# Patient Record
Sex: Female | Born: 1980 | Race: Black or African American | Hispanic: No | Marital: Single | Smoking: Never smoker
Health system: Southern US, Community
[De-identification: ages and names within clinical notes are randomized; demographics above are authoritative.]

## PROBLEM LIST (undated history)

## (undated) DIAGNOSIS — T884XXA Failed or difficult intubation, initial encounter: Secondary | ICD-10-CM

## (undated) DIAGNOSIS — Z789 Other specified health status: Secondary | ICD-10-CM

## (undated) HISTORY — PX: BARIATRIC SURGERY: SHX1103

---

## 2016-06-26 ENCOUNTER — Emergency Department (HOSPITAL_BASED_OUTPATIENT_CLINIC_OR_DEPARTMENT_OTHER)
Admission: EM | Admit: 2016-06-26 | Discharge: 2016-06-26 | Disposition: A | Discharge status: Home / Self Care | Payer: Medicaid Other | Attending: Emergency Medicine | Admitting: Emergency Medicine

## 2016-06-26 ENCOUNTER — Encounter (HOSPITAL_BASED_OUTPATIENT_CLINIC_OR_DEPARTMENT_OTHER): Payer: Self-pay | Admitting: Emergency Medicine

## 2016-06-26 ENCOUNTER — Emergency Department (HOSPITAL_BASED_OUTPATIENT_CLINIC_OR_DEPARTMENT_OTHER): Payer: Medicaid Other

## 2016-06-26 DIAGNOSIS — S6992XA Unspecified injury of left wrist, hand and finger(s), initial encounter: Secondary | ICD-10-CM | POA: Diagnosis present

## 2016-06-26 DIAGNOSIS — S6392XA Sprain of unspecified part of left wrist and hand, initial encounter: Secondary | ICD-10-CM | POA: Insufficient documentation

## 2016-06-26 DIAGNOSIS — S0990XA Unspecified injury of head, initial encounter: Secondary | ICD-10-CM | POA: Insufficient documentation

## 2016-06-26 DIAGNOSIS — Y999 Unspecified external cause status: Secondary | ICD-10-CM | POA: Insufficient documentation

## 2016-06-26 DIAGNOSIS — S63502A Unspecified sprain of left wrist, initial encounter: Secondary | ICD-10-CM

## 2016-06-26 DIAGNOSIS — Y939 Activity, unspecified: Secondary | ICD-10-CM | POA: Insufficient documentation

## 2016-06-26 DIAGNOSIS — Y929 Unspecified place or not applicable: Secondary | ICD-10-CM | POA: Diagnosis not present
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
## 2016-06-26 NOTE — Discharge Instructions (Signed)
Ibuprofen 600 mg every 6 hours as needed for pain.  Rest.  Return to the emergency department if your symptoms significantly worsen or change.

## 2019-05-23 ENCOUNTER — Emergency Department (HOSPITAL_BASED_OUTPATIENT_CLINIC_OR_DEPARTMENT_OTHER)
Admission: EM | Admit: 2019-05-23 | Discharge: 2019-05-23 | Disposition: A | Discharge status: Home / Self Care | Payer: Medicaid Other | Attending: Emergency Medicine | Admitting: Emergency Medicine

## 2019-05-23 ENCOUNTER — Encounter (HOSPITAL_BASED_OUTPATIENT_CLINIC_OR_DEPARTMENT_OTHER): Payer: Self-pay | Admitting: *Deleted

## 2019-05-23 ENCOUNTER — Other Ambulatory Visit: Payer: Self-pay

## 2019-05-23 DIAGNOSIS — Z5321 Procedure and treatment not carried out due to patient leaving prior to being seen by health care provider: Secondary | ICD-10-CM | POA: Diagnosis not present

## 2019-05-23 DIAGNOSIS — R1110 Vomiting, unspecified: Secondary | ICD-10-CM | POA: Insufficient documentation

## 2019-05-23 DIAGNOSIS — R109 Unspecified abdominal pain: Secondary | ICD-10-CM | POA: Insufficient documentation
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
## 2019-05-24 ENCOUNTER — Encounter (HOSPITAL_COMMUNITY)
Admission: EM | Disposition: A | Discharge status: Home / Self Care | Payer: Self-pay | Source: Home / Self Care | Attending: Emergency Medicine

## 2019-05-24 ENCOUNTER — Encounter (HOSPITAL_BASED_OUTPATIENT_CLINIC_OR_DEPARTMENT_OTHER): Payer: Self-pay | Admitting: Emergency Medicine

## 2019-05-24 ENCOUNTER — Ambulatory Visit (HOSPITAL_BASED_OUTPATIENT_CLINIC_OR_DEPARTMENT_OTHER)
Admission: EM | Admit: 2019-05-24 | Discharge: 2019-05-25 | Disposition: A | Discharge status: Home / Self Care | Payer: Medicaid Other | Attending: Family Medicine | Admitting: Family Medicine

## 2019-05-24 ENCOUNTER — Inpatient Hospital Stay (HOSPITAL_COMMUNITY): Payer: Medicaid Other | Admitting: Anesthesiology

## 2019-05-24 ENCOUNTER — Emergency Department (HOSPITAL_BASED_OUTPATIENT_CLINIC_OR_DEPARTMENT_OTHER): Payer: Medicaid Other

## 2019-05-24 DIAGNOSIS — K661 Hemoperitoneum: Secondary | ICD-10-CM | POA: Diagnosis not present

## 2019-05-24 DIAGNOSIS — O00101 Right tubal pregnancy without intrauterine pregnancy: Secondary | ICD-10-CM

## 2019-05-24 DIAGNOSIS — X58XXXA Exposure to other specified factors, initial encounter: Secondary | ICD-10-CM | POA: Insufficient documentation

## 2019-05-24 DIAGNOSIS — N8311 Corpus luteum cyst of right ovary: Secondary | ICD-10-CM | POA: Insufficient documentation

## 2019-05-24 DIAGNOSIS — Z20828 Contact with and (suspected) exposure to other viral communicable diseases: Secondary | ICD-10-CM | POA: Insufficient documentation

## 2019-05-24 DIAGNOSIS — Z30432 Encounter for removal of intrauterine contraceptive device: Secondary | ICD-10-CM | POA: Diagnosis not present

## 2019-05-24 DIAGNOSIS — T85628A Displacement of other specified internal prosthetic devices, implants and grafts, initial encounter: Secondary | ICD-10-CM | POA: Insufficient documentation

## 2019-05-24 DIAGNOSIS — O009 Unspecified ectopic pregnancy without intrauterine pregnancy: Secondary | ICD-10-CM

## 2019-05-24 DIAGNOSIS — Z3A00 Weeks of gestation of pregnancy not specified: Secondary | ICD-10-CM | POA: Diagnosis not present

## 2019-05-24 DIAGNOSIS — Z9884 Bariatric surgery status: Secondary | ICD-10-CM | POA: Insufficient documentation

## 2019-05-24 DIAGNOSIS — R1031 Right lower quadrant pain: Secondary | ICD-10-CM | POA: Diagnosis present

## 2019-05-24 HISTORY — DX: Other specified health status: Z78.9

## 2019-05-24 HISTORY — PX: LAPAROSCOPIC SALPINGO OOPHERECTOMY: SHX5927

## 2019-05-24 HISTORY — DX: Failed or difficult intubation, initial encounter: T88.4XXA

## 2019-05-24 HISTORY — PX: IUD REMOVAL: SHX5392

## 2019-05-24 LAB — TYPE AND SCREEN
ABO/RH(D): O POS
Antibody Screen: NEGATIVE

## 2019-05-24 LAB — CBC
HCT: 36.4 % (ref 36.0–46.0)
Hemoglobin: 11.6 g/dL — ABNORMAL LOW (ref 12.0–15.0)
MCH: 26.4 pg (ref 26.0–34.0)
MCHC: 31.9 g/dL (ref 30.0–36.0)
MCV: 82.7 fL (ref 80.0–100.0)
Platelets: 235 10*3/uL (ref 150–400)
RBC: 4.4 MIL/uL (ref 3.87–5.11)
RDW: 13.9 % (ref 11.5–15.5)
WBC: 5.4 10*3/uL (ref 4.0–10.5)
nRBC: 0 % (ref 0.0–0.2)

## 2019-05-24 LAB — COMPREHENSIVE METABOLIC PANEL
ALT: 12 U/L (ref 0–44)
AST: 15 U/L (ref 15–41)
Albumin: 3.8 g/dL (ref 3.5–5.0)
Alkaline Phosphatase: 55 U/L (ref 38–126)
Anion gap: 7 (ref 5–15)
BUN: 14 mg/dL (ref 6–20)
CO2: 25 mmol/L (ref 22–32)
Calcium: 9.1 mg/dL (ref 8.9–10.3)
Chloride: 102 mmol/L (ref 98–111)
Creatinine, Ser: 0.74 mg/dL (ref 0.44–1.00)
GFR calc Af Amer: >60 mL/min (ref >60)
GFR calc non Af Amer: >60 mL/min (ref >60)
Glucose, Bld: 81 mg/dL (ref 70–99)
Potassium: 3.9 mmol/L (ref 3.5–5.1)
Sodium: 134 mmol/L — ABNORMAL LOW (ref 135–145)
Total Bilirubin: 0.6 mg/dL (ref 0.3–1.2)
Total Protein: 7.1 g/dL (ref 6.5–8.1)

## 2019-05-24 LAB — URINALYSIS, ROUTINE W REFLEX MICROSCOPIC
Bilirubin Urine: NEGATIVE
Glucose, UA: NEGATIVE mg/dL
Ketones, ur: NEGATIVE mg/dL
Leukocytes,Ua: NEGATIVE
Nitrite: NEGATIVE
Protein, ur: NEGATIVE mg/dL
Specific Gravity, Urine: 1.02 (ref 1.005–1.030)
pH: 7 (ref 5.0–8.0)

## 2019-05-24 LAB — PREGNANCY, URINE: Preg Test, Ur: POSITIVE — AB

## 2019-05-24 LAB — URINALYSIS, MICROSCOPIC (REFLEX)

## 2019-05-24 LAB — ABO/RH: ABO/RH(D): O POS

## 2019-05-24 LAB — LIPASE, BLOOD: Lipase: 28 U/L (ref 11–51)

## 2019-05-24 LAB — HCG, QUANTITATIVE, PREGNANCY: hCG, Beta Chain, Quant, S: 3039 m[IU]/mL — ABNORMAL HIGH (ref <5)

## 2019-05-24 LAB — SARS CORONAVIRUS 2 AG (30 MIN TAT): SARS Coronavirus 2 Ag: NEGATIVE

## 2019-05-24 SURGERY — SALPINGO-OOPHORECTOMY, LAPAROSCOPIC
Anesthesia: General

## 2019-05-24 MED ORDER — ROCURONIUM BROMIDE 10 MG/ML (PF) SYRINGE
PREFILLED_SYRINGE | INTRAVENOUS | Status: AC
  Filled 2019-05-24: qty 10

## 2019-05-24 MED ORDER — SODIUM CHLORIDE 0.9 % IR SOLN
Status: DC | PRN
  Administered 2019-05-24: 3000 mL

## 2019-05-24 MED ORDER — PHENYLEPHRINE 40 MCG/ML (10ML) SYRINGE FOR IV PUSH (FOR BLOOD PRESSURE SUPPORT)
PREFILLED_SYRINGE | INTRAVENOUS | Status: AC
  Filled 2019-05-24: qty 10

## 2019-05-24 MED ORDER — PROPOFOL 10 MG/ML IV BOLUS
INTRAVENOUS | Status: AC
  Filled 2019-05-24: qty 20

## 2019-05-24 MED ORDER — LACTATED RINGERS IV BOLUS
1000.0000 mL | Freq: Once | INTRAVENOUS | Status: AC
  Administered 2019-05-24: 1000 mL via INTRAVENOUS

## 2019-05-24 MED ORDER — ROCURONIUM 10MG/ML (10ML) SYRINGE FOR MEDFUSION PUMP - OPTIME
INTRAVENOUS | Status: DC | PRN
  Administered 2019-05-24: 50 mg via INTRAVENOUS

## 2019-05-24 MED ORDER — FENTANYL CITRATE (PF) 250 MCG/5ML IJ SOLN
INTRAMUSCULAR | Status: DC | PRN
  Administered 2019-05-24: 50 ug via INTRAVENOUS
  Administered 2019-05-24: 100 ug via INTRAVENOUS
  Administered 2019-05-24: 50 ug via INTRAVENOUS

## 2019-05-24 MED ORDER — SUCCINYLCHOLINE CHLORIDE 200 MG/10ML IV SOSY
PREFILLED_SYRINGE | INTRAVENOUS | Status: AC
  Filled 2019-05-24: qty 10

## 2019-05-24 MED ORDER — SUCCINYLCHOLINE CHLORIDE 20 MG/ML IJ SOLN
INTRAMUSCULAR | Status: DC | PRN
  Administered 2019-05-24: 120 mg via INTRAVENOUS

## 2019-05-24 MED ORDER — SODIUM CHLORIDE 0.9% FLUSH
3.0000 mL | Freq: Once | INTRAVENOUS | Status: DC
  Filled 2019-05-24: qty 3

## 2019-05-24 MED ORDER — OXYCODONE HCL 5 MG PO TABS: 5.0000 mg | ORAL_TABLET | Freq: Four times a day (QID) | ORAL | 0 refills | Status: AC | PRN

## 2019-05-24 MED ORDER — PROPOFOL 10 MG/ML IV BOLUS
INTRAVENOUS | Status: DC | PRN
  Administered 2019-05-24: 200 mg via INTRAVENOUS
  Administered 2019-05-24: 150 mg via INTRAVENOUS

## 2019-05-24 MED ORDER — DEXAMETHASONE SODIUM PHOSPHATE 10 MG/ML IJ SOLN
INTRAMUSCULAR | Status: AC
  Filled 2019-05-24: qty 2

## 2019-05-24 MED ORDER — SUGAMMADEX SODIUM 200 MG/2ML IV SOLN
INTRAVENOUS | Status: DC | PRN
  Administered 2019-05-24: 200 mg via INTRAVENOUS

## 2019-05-24 MED ORDER — LACTATED RINGERS IV SOLN: INTRAVENOUS | Status: DC | PRN

## 2019-05-24 MED ORDER — BUPIVACAINE HCL (PF) 0.25 % IJ SOLN
INTRAMUSCULAR | Status: DC | PRN
  Administered 2019-05-24: 10 mL

## 2019-05-24 MED ORDER — ONDANSETRON HCL 4 MG/2ML IJ SOLN
INTRAMUSCULAR | Status: AC
  Filled 2019-05-24: qty 2

## 2019-05-24 MED ORDER — MIDAZOLAM HCL 2 MG/2ML IJ SOLN
INTRAMUSCULAR | Status: AC
  Filled 2019-05-24: qty 2

## 2019-05-24 MED ORDER — ONDANSETRON HCL 4 MG/2ML IJ SOLN
INTRAMUSCULAR | Status: DC | PRN
  Administered 2019-05-24: 4 mg via INTRAVENOUS

## 2019-05-24 MED ORDER — LIDOCAINE 2% (20 MG/ML) 5 ML SYRINGE
INTRAMUSCULAR | Status: AC
  Filled 2019-05-24: qty 5

## 2019-05-24 MED ORDER — DEXAMETHASONE SODIUM PHOSPHATE 10 MG/ML IJ SOLN
INTRAMUSCULAR | Status: DC | PRN
  Administered 2019-05-24: 10 mg via INTRAVENOUS

## 2019-05-24 MED ORDER — LIDOCAINE HCL (CARDIAC) PF 100 MG/5ML IV SOSY
PREFILLED_SYRINGE | INTRAVENOUS | Status: DC | PRN
  Administered 2019-05-24: 100 mg via INTRATRACHEAL

## 2019-05-24 MED ORDER — GLYCOPYRROLATE 0.2 MG/ML IJ SOLN
INTRAMUSCULAR | Status: DC | PRN
  Administered 2019-05-24: .2 mg via INTRAVENOUS

## 2019-05-24 MED ORDER — EPHEDRINE 5 MG/ML INJ
INTRAVENOUS | Status: AC
  Filled 2019-05-24: qty 10

## 2019-05-24 MED ORDER — MIDAZOLAM HCL 2 MG/2ML IJ SOLN
INTRAMUSCULAR | Status: DC | PRN
  Administered 2019-05-24: 2 mg via INTRAVENOUS

## 2019-05-24 MED ORDER — FENTANYL CITRATE (PF) 250 MCG/5ML IJ SOLN
INTRAMUSCULAR | Status: AC
  Filled 2019-05-24: qty 5

## 2019-05-24 SURGICAL SUPPLY — 26 items
BLADE SURG 15 STRL LF DISP TIS (BLADE) ×2 IMPLANT
BLADE SURG 15 STRL SS (BLADE) ×2
DRSG OPSITE POSTOP 3X4 (GAUZE/BANDAGES/DRESSINGS) ×4 IMPLANT
DURAPREP 26ML APPLICATOR (WOUND CARE) ×4 IMPLANT
GLOVE BIOGEL PI IND STRL 7.0 (GLOVE) ×8 IMPLANT
GLOVE BIOGEL PI INDICATOR 7.0 (GLOVE) ×8
GLOVE ECLIPSE 7.0 STRL STRAW (GLOVE) ×4 IMPLANT
GOWN STRL REUS W/ TWL LRG LVL3 (GOWN DISPOSABLE) ×6 IMPLANT
GOWN STRL REUS W/TWL LRG LVL3 (GOWN DISPOSABLE) ×6
KIT TURNOVER KIT B (KITS) ×4 IMPLANT
PACK LAPAROSCOPY BASIN (CUSTOM PROCEDURE TRAY) ×4 IMPLANT
PACK TRENDGUARD 450 HYBRID PRO (MISCELLANEOUS) ×2 IMPLANT
POUCH SPECIMEN RETRIEVAL 10MM (ENDOMECHANICALS) ×4 IMPLANT
PROTECTOR NERVE ULNAR (MISCELLANEOUS) ×8 IMPLANT
SET IRRIG TUBING LAPAROSCOPIC (IRRIGATION / IRRIGATOR) IMPLANT
SET TUBE SMOKE EVAC HIGH FLOW (TUBING) ×4 IMPLANT
SHEARS HARMONIC ACE PLUS 36CM (ENDOMECHANICALS) ×4 IMPLANT
SLEEVE ENDOPATH XCEL 5M (ENDOMECHANICALS) ×4 IMPLANT
SUT VIC AB 3-0 X1 27 (SUTURE) ×4 IMPLANT
SUT VICRYL 0 UR6 27IN ABS (SUTURE) ×8 IMPLANT
TOWEL GREEN STERILE FF (TOWEL DISPOSABLE) ×8 IMPLANT
TRAY FOLEY W/BAG SLVR 14FR (SET/KITS/TRAYS/PACK) ×4 IMPLANT
TRENDGUARD 450 HYBRID PRO PACK (MISCELLANEOUS) ×4
TROCAR BALLN 12MMX100 BLUNT (TROCAR) ×4 IMPLANT
TROCAR XCEL NON-BLD 5MMX100MML (ENDOMECHANICALS) ×4 IMPLANT
WARMER LAPAROSCOPE (MISCELLANEOUS) ×4 IMPLANT

## 2019-05-24 NOTE — ED Notes (Signed)
Patient transported to Ultrasound 

## 2019-05-24 NOTE — MAU Note (Signed)
.   Caitlin Carpenter is a 38 y.o. here in MAU reporting: arrived via EMS with report of ectopic pregnancy that may require surgery. Patient reports heavy vaginal bleeding.   Pain score: 5 Vitals:   05/24/19 2104 05/24/19 2146  BP: 115/82 123/75  Pulse: 65 66  Resp: 16 16  Temp: 98.4 F (36.9 C) 98.6 F (37 C)  SpO2: 100% 100%

## 2019-05-24 NOTE — Anesthesia Procedure Notes (Signed)
Procedure Name: Intubation Date/Time: 05/24/2019 10:39 PM Performed by: Audry Pili, MD Pre-anesthesia Checklist: Patient identified, Emergency Drugs available, Suction available, Patient being monitored and Timeout performed Patient Re-evaluated:Patient Re-evaluated prior to induction Oxygen Delivery Method: Circle system utilized Preoxygenation: Pre-oxygenation with 100% oxygen Induction Type: IV induction, Rapid sequence and Cricoid Pressure applied Ventilation: Mask ventilation without difficulty Laryngoscope Size: Glidescope and 3 Grade View: Grade II Tube type: Oral Tube size: 7.0 mm Number of attempts: 4 Airway Equipment and Method: Stylet and Video-laryngoscopy Placement Confirmation: ETT inserted through vocal cords under direct vision,  breath sounds checked- equal and bilateral and CO2 detector Secured at: 23 cm Tube secured with: Tape Dental Injury: Teeth and Oropharynx as per pre-operative assessment  Difficulty Due To: Difficulty was unanticipated, Difficult Airway- due to large tongue, Difficult Airway- due to anterior larynx and Difficult Airway-  due to edematous airway Comments: First look Mac 3, grade 3 view. Second look by Dr. Fransisco Beau with Sabra Heck 2, grade 2B view, unable to angle ETT anteriorly enough for passage into trachea. Masked between attempts without difficulty. Glidescope grade 2a view, intubated successfully.

## 2019-05-24 NOTE — Transfer of Care (Signed)
Immediate Anesthesia Transfer of Care Note  Patient: Caitlin Carpenter  Procedure(s) Performed: LAPAROSCOPIC LEFT SALPINGECTOMY WITH REMOVAL OF ECTOPIC PREGNANCY (N/A ) Intrauterine Device (Iud) Removal  Patient Location: PACU  Anesthesia Type:General  Level of Consciousness: oriented, drowsy, patient cooperative and Patient remains intubated per anesthesia plan  Airway & Oxygen Therapy: Patient Spontanous Breathing and Patient connected to nasal cannula oxygen  Post-op Assessment: Report given to RN, Post -op Vital signs reviewed and stable and Patient moving all extremities X 4  Post vital signs: Reviewed and stable  Last Vitals:  Vitals Value Taken Time  BP    Temp 36.4 C 05/24/19 2354  Pulse 90 05/24/19 2354  Resp 23 05/24/19 2358  SpO2    Vitals shown include unvalidated device data.  Last Pain:  Vitals:   05/24/19 2150  TempSrc:   PainSc: 5          Complications: No apparent anesthesia complications

## 2019-05-24 NOTE — Op Note (Signed)
PREOPERATIVE DIAGNOSIS: Probable ruptured ectopic pregnancy, malpositioned IUD  POSTOPERATIVE DIAGNOSIS: Same  PROCEDURE: Laparoscopic right salpingectomy, IUD removal   INDICATIONS: 38 y.o. G4P3 at Unknown here for with ruptured ectopic pregnancy with blood type O pos. Patient was counseled regarding need for laparoscopic salpingectomy. Risks of surgery including bleeding which may require transfusion or reoperation, infection, injury to bowel or other surrounding organs, need for additional procedures including laparotomy and other postoperative/anesthesia complications were explained to patient.  Written informed consent was obtained  FINDINGS: IUD in LUS, moderate amount of hemoperitoneum estimated to be about 200 cc of blood and clots.  Dilated right fallopian tube containing ectopic gestation. Small normal appearing uterus, normal left fallopian tube, right ovary and left ovary.  ANESTHESIA: General  SPECIMENS: right fallopian tube to pathology  COMPLICATIONS: None immediate  PROCEDURE IN DETAIL:  The patient was taken to the operating room where general anesthesia was administered and was found to be adequate.  She was placed in the dorsal lithotomy position, and was prepped and draped in a sterile manner.  A Foley catheter was inserted into her bladder and attached to constant drainage. A speculum placed and IUD removed intact. A uterine manipulator was then advanced into the uterus .  After an adequate timeout was performed, attention was then turned to the patient's abdomen where a 10-mm skin incision was made in the umbilical fold. Fascia and peritoneum were entered sharply.  A 0 Vicryl suture was used to tag the fascia circumferentially.  A Hassan trocar was placed. The laparoscope was introduced.  A survey of the patient's pelvis and abdomen revealed the findings as above.  Two left lower quadrant ports were placed under direct visualization; 5-mm x 2.  Attention was then turned to the  right fallopian tube which was grasped and ligated from the underlying mesosalpinx and uterine attachment using the Harmonic instrument.  Good hemostasis was noted.  The specimen was placed in an EndoCatch bag and removed from the abdomen intact. Clot and blood removed with the Nezjhat. The abdomen was desufflated, and all instruments were removed.  The umbilicus incision was closed with the afore mentioned Vicryl suture; and all skin incisions were closed with a 3-0 Vicryl subcuticular stitch followed by DermaBond. The patient tolerated the procedure well.  All instruments, needles, and sponge counts were correct x 2. The patient was taken to the recovery room in stable condition.   Donnamae Jude MD 05/24/2019 11:42 PM
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
## 2019-05-24 NOTE — ED Provider Notes (Addendum)
[facility] EMERGENCY DEPARTMENT Provider Note   CSN: [id number] Arrival date & time: 05/24/19  1434     History Chief Complaint  Patient presents with  . Abdominal Pain    Caitlin Carpenter is a 38 y.o. female.  Presents to ER with abdominal pain.  Patient states pain primarily started yesterday, worsening throughout the day today.  States she has an appointment with her doctor tomorrow but felt the pain was too severe.  Pain intermittent, right lower quadrant, currently mild in severity.  Crampy, no radiation.  States her.  Started a couple weeks ago but has had some intermittent bleeding, has noted small vaginal bleeding today.  States she has a Mirena IUD, denies any past gynecologic or obstetric complications.   HPI     History reviewed. No pertinent past medical history.  There are no problems to display for this patient.   History reviewed. No pertinent surgical history.   OB History   No obstetric history on file.     No family history on file.  Social History   Tobacco Use  . Smoking status: Never Smoker  . Smokeless tobacco: Never Used  Substance Use Topics  . Alcohol use: No  . Drug use: No    Home Medications Prior to Admission medications   Medication Sig Start Date End Date Taking? Authorizing Provider  Multiple Vitamin (MULTIVITAMIN) tablet Take by mouth.    [provider]  phentermine (ADIPEX-P) 37.5 MG tablet 1 tab po 30 min before breakfast daily 05/05/19   [provider]    Allergies    Patient has no known allergies.  Review of Systems   Review of Systems  Constitutional: Negative for chills and fever.  HENT: Negative for ear pain and sore throat.   Eyes: Negative for pain and visual disturbance.  Respiratory: Negative for cough and shortness of breath.   Cardiovascular: Negative for chest pain and palpitations.  Gastrointestinal: Positive for abdominal pain. Negative for vomiting.  Genitourinary: Positive  for vaginal bleeding. Negative for dysuria and hematuria.  Musculoskeletal: Negative for arthralgias and back pain.  Skin: Negative for color change and rash.  Neurological: Negative for seizures and syncope.  All other systems reviewed and are negative.   Physical Exam Updated Vital Signs BP 115/82   Pulse 65   Temp 98.4 F (36.9 C)   Resp 16   SpO2 100%   Physical Exam Vitals and nursing note reviewed.  Constitutional:      General: She is not in acute distress.    Appearance: She is well-developed.  HENT:     Head: Normocephalic and atraumatic.  Eyes:     Conjunctiva/sclera: Conjunctivae normal.  Cardiovascular:     Rate and Rhythm: Normal rate and regular rhythm.     Heart sounds: No murmur.  Pulmonary:     Effort: Pulmonary effort is normal. No respiratory distress.     Breath sounds: Normal breath sounds.  Abdominal:     Palpations: Abdomen is soft.     Comments: Mild tenderness to palpation right lower quadrant, abdomen soft, no rebound or guarding  Musculoskeletal:     Cervical back: Neck supple.  Skin:    General: Skin is warm and dry.     Capillary Refill: Capillary refill takes less than 2 seconds.  Neurological:     Mental Status: She is alert.     ED Results / Procedures / Treatments   Labs (all labs ordered are listed, but only abnormal results  are displayed) Labs Reviewed  COMPREHENSIVE METABOLIC PANEL - Abnormal; Notable for the following components:      Result Value   Sodium 134 (*)    All other components within normal limits  CBC - Abnormal; Notable for the following components:   Hemoglobin 11.6 (*)    All other components within normal limits  URINALYSIS, ROUTINE W REFLEX MICROSCOPIC - Abnormal; Notable for the following components:   Hgb urine dipstick LARGE (*)    All other components within normal limits  PREGNANCY, URINE - Abnormal; Notable for the following components:   Preg Test, Ur POSITIVE (*)    All other components within  normal limits  URINALYSIS, MICROSCOPIC (REFLEX) - Abnormal; Notable for the following components:   Bacteria, UA MANY (*)    All other components within normal limits  HCG, QUANTITATIVE, PREGNANCY - Abnormal; Notable for the following components:   hCG, Beta Chain, Quant, S 3,039 (*)    All other components within normal limits  SARS CORONAVIRUS 2 AG (30 MIN TAT)  RESPIRATORY PANEL BY RT PCR (FLU A&B, COVID)  LIPASE, BLOOD  ABO/RH    EKG None  Radiology US OB Comp < 14 Wks  Result Date: 05/24/2019 CLINICAL DATA:  Right lower quadrant pain beginning yesterday. Unknown LMP. IUD. EXAM: OBSTETRIC <14 WK US AND TRANSVAGINAL OB US DOPPLER ULTRASOUND OF OVARIES TECHNIQUE: Both transabdominal and transvaginal ultrasound examinations were performed for complete evaluation of the gestation as well as the maternal uterus, adnexal regions, and pelvic cul-de-sac. Transvaginal technique was performed to assess early pregnancy. Color and duplex Doppler ultrasound was utilized to evaluate blood flow to the ovaries. COMPARISON:  None. FINDINGS: Intrauterine gestational sac: None Maternal uterus/adnexae: IUD seen is seen within the endometrial cavity, however the distal tip of the IUD is located in the lower uterine segment. No fibroids identified. The left ovary is normal in appearance. A 2.2 cm right ovarian corpus luteum cyst is seen. A ovoid mass with a cystic area resembling a gestational sac is seen in the right adnexa which measures 4.3 x 1.7 by 2.2 cm. This is highly suspicious for ectopic pregnancy. A small amount of complex free fluid is also seen in the pelvic cul-de-sac. Pulsed Doppler evaluation of both ovaries demonstrates normal appearing low-resistance arterial and venous waveforms. IMPRESSION: 4.3 cm right adnexal mass and small amount of free fluid, highly suspicious for ectopic pregnancy. Abnormally low IUD position within the endometrial cavity, with distal tip in lower uterine segment. No  sonographic evidence for ovarian torsion. Critical Value/emergent results were called by telephone at the time of interpretation on 05/24/2019 at 7:52 pm [facility] , who verbally acknowledged these results. Electronically Signed   By: Danae OrleansJohn A Stahl M.D.   On: 05/24/2019 19:57   US OB Transvaginal  Result Date: 05/24/2019 CLINICAL DATA:  Right lower quadrant pain beginning yesterday. Unknown LMP. IUD. EXAM: OBSTETRIC <14 WK US AND TRANSVAGINAL OB US DOPPLER ULTRASOUND OF OVARIES TECHNIQUE: Both transabdominal and transvaginal ultrasound examinations were performed for complete evaluation of the gestation as well as the maternal uterus, adnexal regions, and pelvic cul-de-sac. Transvaginal technique was performed to assess early pregnancy. Color and duplex Doppler ultrasound was utilized to evaluate blood flow to the ovaries. COMPARISON:  None. FINDINGS: Intrauterine gestational sac: None Maternal uterus/adnexae: IUD seen is seen within the endometrial cavity, however the distal tip of the IUD is located in the lower uterine segment. No fibroids identified. The left ovary is normal in appearance. A 2.2  cm right ovarian corpus luteum cyst is seen. A ovoid mass with a cystic area resembling a gestational sac is seen in the right adnexa which measures 4.3 x 1.7 by 2.2 cm. This is highly suspicious for ectopic pregnancy. A small amount of complex free fluid is also seen in the pelvic cul-de-sac. Pulsed Doppler evaluation of both ovaries demonstrates normal appearing low-resistance arterial and venous waveforms. IMPRESSION: 4.3 cm right adnexal mass and small amount of free fluid, highly suspicious for ectopic pregnancy. Abnormally low IUD position within the endometrial cavity, with distal tip in lower uterine segment. No sonographic evidence for ovarian torsion. Critical Value/emergent results were called by telephone at the time of interpretation on 05/24/2019 at 7:52 pm to provider [facility]  , who verbally acknowledged these results. Electronically Signed   By: Danae Orleans M.D.   On: 05/24/2019 19:57   US Abdomen Limited  Result Date: 05/24/2019 CLINICAL DATA:  Right lower quadrant pain beginning yesterday. EXAM: ULTRASOUND ABDOMEN LIMITED TECHNIQUE: Wallace Cullens scale imaging of the right lower quadrant was performed to evaluate for suspected appendicitis. Standard imaging planes and graded compression technique were utilized. COMPARISON:  None. FINDINGS: The appendix is not visualized. Ancillary findings: None. Factors affecting image quality: Suboptimal due to body habitus Other findings: None. IMPRESSION: Suboptimal exam due to body habitus. Non visualization of the appendix. Non-visualization of appendix by [place] does not exclude appendicitis. Electronically Signed   By: Danae Orleans M.D.   On: 05/24/2019 19:59   US PELVIC DOPPLER LIMITED  Result Date: 05/24/2019 CLINICAL DATA:  Right lower quadrant pain beginning yesterday. Unknown LMP. IUD. EXAM: OBSTETRIC <14 WK [place] AND TRANSVAGINAL OB US DOPPLER ULTRASOUND OF OVARIES TECHNIQUE: Both transabdominal and transvaginal ultrasound examinations were performed for complete evaluation of the gestation as well as the maternal uterus, adnexal regions, and pelvic cul-de-sac. Transvaginal technique was performed to assess early pregnancy. Color and duplex Doppler ultrasound was utilized to evaluate blood flow to the ovaries. COMPARISON:  None. FINDINGS: Intrauterine gestational sac: None Maternal uterus/adnexae: IUD seen is seen within the endometrial cavity, however the distal tip of the IUD is located in the lower uterine segment. No fibroids identified. The left ovary is normal in appearance. A 2.2 cm right ovarian corpus luteum cyst is seen. A ovoid mass with a cystic area resembling a gestational sac is seen in the right adnexa which measures 4.3 x 1.7 by 2.2 cm. This is highly suspicious for ectopic pregnancy. A small amount of complex free fluid is  also seen in the pelvic cul-de-sac. Pulsed Doppler evaluation of both ovaries demonstrates normal appearing low-resistance arterial and venous waveforms. IMPRESSION: 4.3 cm right adnexal mass and small amount of free fluid, highly suspicious for ectopic pregnancy. Abnormally low IUD position within the endometrial cavity, with distal tip in lower uterine segment. No sonographic evidence for ovarian torsion. Critical Value/emergent results were called by telephone at the time of interpretation on 05/24/2019 at 7:52 pm to provider [facility] , who verbally acknowledged these results. Electronically Signed   By: Danae Orleans M.D.   On: 05/24/2019 19:57    Procedures .Critical Care Performed by: Milagros Loll, MD Authorized by: Milagros Loll, MD   Critical care provider statement:    Critical care time (minutes):  45   Critical care was necessary to treat or prevent imminent or life-threatening deterioration of the following conditions: ectopic pregnancy.   Critical care was time spent personally by me on the following activities:  Discussions with consultants,  evaluation of patient's response to treatment, examination of patient, ordering and performing treatments and interventions, ordering and review of laboratory studies, ordering and review of radiographic studies, pulse oximetry, re-evaluation of patient's condition, obtaining history from patient or surrogate and review of old charts   (including critical care time)  Medications Ordered in ED Medications  sodium chloride flush (NS) 0.9 % injection 3 mL (3 mLs Intravenous Not Given 05/24/19 1536)    ED Course  I have reviewed the triage vital signs and the nursing notes.  Pertinent labs & imaging results that were available during my care of the patient were reviewed by me and considered in my medical decision making (see chart for details).  Clinical Course as of May 23 2105  Tue May 24, 2019  2001 Notified by radiology -  will consult obgyn   [RD]  2017 [facility], updated patient, staff; discussed with her primary gynecologist, Dr. Shawnie Pons who agrees with our current plan   [RD]    Clinical Course User Index [RD] Milagros Loll, MD   MDM Rules/Calculators/A&P                      38 year old lady who presents to ER with right lower quadrant abdominal pain.  Found to have positive pregnancy, patient has IUD.  Ordered ultrasound to evaluate for ectopic.  Ultrasound concerning for ectopic pregnancy with likely rupture.  She is hemodynamically stable, hemoglobin stable.  Patient is well-appearing.  After receiving this critical result, reached out to OBGYN at [facility], Dr. Tinnie Gens will accept patient to [facility]. Kept NPO in anticipation of likely surgery. Patient will be transported via EMS.   Final Clinical Impression(s) / ED Diagnoses Final diagnoses:  Ectopic pregnancy without intrauterine pregnancy, unspecified location    Rx / DC Orders ED Discharge Orders    None       Milagros Loll, MD 05/24/19 2103    Milagros Loll, MD 05/24/19 2106

## 2019-05-24 NOTE — ED Triage Notes (Signed)
RLQ pain radiating into her back since yesterday.

## 2019-05-24 NOTE — H&P (Addendum)
Caitlin Carpenter is an 38 y.o. G4P3 female.   Chief Complaint: abdominal pain HPI: Patient with 1 day h/o menstrual like cramps. Acutely worse today, with right sided pain. Has IUD in. S/p bariatric surgery 2 years ago.  Past Medical History:  Diagnosis Date  . Medical history non-contributory     Past Surgical History:  Procedure Laterality Date  . BARIATRIC SURGERY      No family history on file. Social History:  reports that she has never smoked. She has never used smokeless tobacco. She reports that she does not drink alcohol or use drugs.  Allergies: No Known Allergies  Medications Prior to Admission  Medication Sig Dispense Refill  . Multiple Vitamin (MULTIVITAMIN) tablet Take by mouth.    . phentermine (ADIPEX-P) 37.5 MG tablet 1 tab po 30 min before breakfast daily      Pertinent items are noted in HPI.  Blood pressure 123/75, pulse 66, temperature 98.6 F (37 C), temperature source Oral, resp. rate 16, height 5\' 7"  (1.702 m), weight 102.1 kg, last menstrual period 05/17/2019, SpO2 100 %. General appearance: alert, cooperative and appears stated age Head: Normocephalic, without obvious abnormality, atraumatic Neck: supple, symmetrical, trachea midline Lungs: normal effort Heart: regular rate and rhythm Abdomen: soft, non-tender; bowel sounds normal; no masses,  no organomegaly Extremities: extremities normal, atraumatic, no cyanosis or edema Skin: Skin color, texture, turgor normal. No rashes or lesions Neurologic: Grossly normal   Lab Results  Component Value Date   WBC 5.4 05/24/2019   HGB 11.6 (L) 05/24/2019   HCT 36.4 05/24/2019   MCV 82.7 05/24/2019   PLT 235 05/24/2019   Lab Results  Component Value Date   PREGTESTUR POSITIVE (A) 05/24/2019   US OB Comp < 14 Wks  Result Date: 05/24/2019 CLINICAL DATA:  Right lower quadrant pain beginning yesterday. Unknown LMP. IUD. EXAM: OBSTETRIC <14 WK US AND TRANSVAGINAL OB US DOPPLER ULTRASOUND OF OVARIES  TECHNIQUE: Both transabdominal and transvaginal ultrasound examinations were performed for complete evaluation of the gestation as well as the maternal uterus, adnexal regions, and pelvic cul-de-sac. Transvaginal technique was performed to assess early pregnancy. Color and duplex Doppler ultrasound was utilized to evaluate blood flow to the ovaries. COMPARISON:  None. FINDINGS: Intrauterine gestational sac: None Maternal uterus/adnexae: IUD seen is seen within the endometrial cavity, however the distal tip of the IUD is located in the lower uterine segment. No fibroids identified. The left ovary is normal in appearance. A 2.2 cm right ovarian corpus luteum cyst is seen. A ovoid mass with a cystic area resembling a gestational sac is seen in the right adnexa which measures 4.3 x 1.7 by 2.2 cm. This is highly suspicious for ectopic pregnancy. A small amount of complex free fluid is also seen in the pelvic cul-de-sac. Pulsed Doppler evaluation of both ovaries demonstrates normal appearing low-resistance arterial and venous waveforms. IMPRESSION: 4.3 cm right adnexal mass and small amount of free fluid, highly suspicious for ectopic pregnancy. Abnormally low IUD position within the endometrial cavity, with distal tip in lower uterine segment. No sonographic evidence for ovarian torsion. Critical Value/emergent results were called by telephone at the time of interpretation on 05/24/2019 at 7:52 pm to provider [facility] , who verbally acknowledged these results. Electronically Signed   By: Danae OrleansJohn A Stahl M.D.   On: 05/24/2019 19:57   US OB Transvaginal  Result Date: 05/24/2019 CLINICAL DATA:  Right lower quadrant pain beginning yesterday. Unknown LMP. IUD. EXAM: OBSTETRIC <14 WK US AND TRANSVAGINAL OB [place]  DOPPLER ULTRASOUND OF OVARIES TECHNIQUE: Both transabdominal and transvaginal ultrasound examinations were performed for complete evaluation of the gestation as well as the maternal uterus, adnexal regions, and  pelvic cul-de-sac. Transvaginal technique was performed to assess early pregnancy. Color and duplex Doppler ultrasound was utilized to evaluate blood flow to the ovaries. COMPARISON:  None. FINDINGS: Intrauterine gestational sac: None Maternal uterus/adnexae: IUD seen is seen within the endometrial cavity, however the distal tip of the IUD is located in the lower uterine segment. No fibroids identified. The left ovary is normal in appearance. A 2.2 cm right ovarian corpus luteum cyst is seen. A ovoid mass with a cystic area resembling a gestational sac is seen in the right adnexa which measures 4.3 x 1.7 by 2.2 cm. This is highly suspicious for ectopic pregnancy. A small amount of complex free fluid is also seen in the pelvic cul-de-sac. Pulsed Doppler evaluation of both ovaries demonstrates normal appearing low-resistance arterial and venous waveforms. IMPRESSION: 4.3 cm right adnexal mass and small amount of free fluid, highly suspicious for ectopic pregnancy. Abnormally low IUD position within the endometrial cavity, with distal tip in lower uterine segment. No sonographic evidence for ovarian torsion. Critical Value/emergent results were called by telephone at the time of interpretation on 05/24/2019 at 7:52 pm to provider [facility] , who verbally acknowledged these results. Electronically Signed   By: Danae Orleans M.D.   On: 05/24/2019 19:57   US Abdomen Limited  Result Date: 05/24/2019 CLINICAL DATA:  Right lower quadrant pain beginning yesterday. EXAM: ULTRASOUND ABDOMEN LIMITED TECHNIQUE: Wallace Cullens scale imaging of the right lower quadrant was performed to evaluate for suspected appendicitis. Standard imaging planes and graded compression technique were utilized. COMPARISON:  None. FINDINGS: The appendix is not visualized. Ancillary findings: None. Factors affecting image quality: Suboptimal due to body habitus Other findings: None. IMPRESSION: Suboptimal exam due to body habitus. Non visualization of  the appendix. Non-visualization of appendix by [place] does not exclude appendicitis. Electronically Signed   By: Danae Orleans M.D.   On: 05/24/2019 19:59   US PELVIC DOPPLER LIMITED  Result Date: 05/24/2019 CLINICAL DATA:  Right lower quadrant pain beginning yesterday. Unknown LMP. IUD. EXAM: OBSTETRIC <14 WK [place] AND TRANSVAGINAL OB US DOPPLER ULTRASOUND OF OVARIES TECHNIQUE: Both transabdominal and transvaginal ultrasound examinations were performed for complete evaluation of the gestation as well as the maternal uterus, adnexal regions, and pelvic cul-de-sac. Transvaginal technique was performed to assess early pregnancy. Color and duplex Doppler ultrasound was utilized to evaluate blood flow to the ovaries. COMPARISON:  None. FINDINGS: Intrauterine gestational sac: None Maternal uterus/adnexae: IUD seen is seen within the endometrial cavity, however the distal tip of the IUD is located in the lower uterine segment. No fibroids identified. The left ovary is normal in appearance. A 2.2 cm right ovarian corpus luteum cyst is seen. A ovoid mass with a cystic area resembling a gestational sac is seen in the right adnexa which measures 4.3 x 1.7 by 2.2 cm. This is highly suspicious for ectopic pregnancy. A small amount of complex free fluid is also seen in the pelvic cul-de-sac. Pulsed Doppler evaluation of both ovaries demonstrates normal appearing low-resistance arterial and venous waveforms. IMPRESSION: 4.3 cm right adnexal mass and small amount of free fluid, highly suspicious for ectopic pregnancy. Abnormally low IUD position within the endometrial cavity, with distal tip in lower uterine segment. No sonographic evidence for ovarian torsion. Critical Value/emergent results were called by telephone at the time of interpretation on 05/24/2019 at 7:52  pm to provider Madalyn Rob , who verbally acknowledged these results. Electronically Signed   By: Marlaine Hind M.D.   On: 05/24/2019 19:57    Assessment/Plan Principal Problem:   Ruptured right tubal ectopic pregnancy causing hemoperitoneum  For laparoscopic removal with IUD removal. Risks include but are not limited to bleeding, infection, injury to surrounding structures, including bowel, bladder and ureters, blood clots, and death.  Likelihood of success is high.  Caitlin Carpenter 05/24/2019, 10:03 PM

## 2019-05-24 NOTE — Discharge Instructions (Signed)
Ectopic Laparoscopy, Care After This sheet gives you information about how to care for yourself after your procedure. Your health care provider may also give you more specific instructions. If you have problems or questions, contact your health care provider. What can I expect after the procedure? After the procedure, it is common to have:  Mild discomfort in the abdomen.  Sore throat. Women who have laparoscopy with pelvic examination may have mild cramping and fluid coming from the vagina for a few days after the procedure. Follow these instructions at home: Medicines  Take over-the-counter and prescription medicines only as told by your health care provider.  If you were prescribed an antibiotic medicine, take it as told by your health care provider. Do not stop taking the antibiotic even if you start to feel better. Driving  Do not drive for 24 hours if you were given a medicine to help you relax (sedative) during your procedure.  Do not drive or use heavy machinery while taking prescription pain medicine. Bathing  Do not take baths, swim, or use a hot tub until your health care provider approves. You may take showers. Incision care   Follow instructions from your health care provider about how to take care of your incisions. Make sure you: ? Wash your hands with soap and water before you change your bandage (dressing). If soap and water are not available, use hand sanitizer. ? Change your dressing as told by your health care provider. ? Leave stitches (sutures), skin glue, or adhesive strips in place. These skin closures may need to stay in place for 2 weeks or longer. If adhesive strip edges start to loosen and curl up, you may trim the loose edges. Do not remove adhesive strips completely unless your health care provider tells you to do that.  Check your incision areas every day for signs of infection. Check for: ? Redness, swelling, or pain. ? Fluid or  blood. ? Warmth. ? Pus or a bad smell. Activity  Return to your normal activities as told by your health care provider. Ask your health care provider what activities are safe for you.  Do not lift anything that is heavier than 10 lb (4.5 kg), or the limit that you are told, until your health care provider says that it is safe. General instructions  To prevent or treat constipation while you are taking prescription pain medicine, your health care provider may recommend that you: ? Drink enough fluid to keep your urine pale yellow. ? Take over-the-counter or prescription medicines. ? Eat foods that are high in fiber, such as fresh fruits and vegetables, whole grains, and beans. ? Limit foods that are high in fat and processed sugars, such as fried and sweet foods.  Do not use any products that contain nicotine or tobacco, such as cigarettes and e-cigarettes. If you need help quitting, ask your health care provider.  Keep all follow-up visits as told by your health care provider. This is important. Contact a health care provider if:  You develop shoulder pain.  You feel lightheaded or faint.  You are unable to pass gas or have a bowel movement.  You feel nauseous or you vomit.  You develop a rash.  You have redness, swelling, or pain around any incision.  You have fluid or blood coming from any incision.  Any incision feels warm to the touch.  You have pus or a bad smell coming from any incision.  You have a fever or chills. Get help  right away if:  You have severe pain.  You have vomiting that does not go away.  You have heavy bleeding from the vagina.  Any incision opens.  You have trouble breathing.  You have chest pain. Summary  After the procedure, it is common to have mild discomfort in the abdomen and a sore throat.  Check your incision areas every day for signs of infection.  Return to your normal activities as told by your health care provider. Ask  your health care provider what activities are safe for you. This information is not intended to replace advice given to you by your health care provider. Make sure you discuss any questions you have with your health care provider. Document Released: 04/23/2015 Document Revised: 04/24/2017 Document Reviewed: 11/05/2016 Elsevier Patient Education  2020 [employer].

## 2019-05-24 NOTE — Anesthesia Procedure Notes (Deleted)
Procedure Name: Intubation Date/Time: 05/24/2019 10:32 PM Performed by: Claris Che, CRNA Pre-anesthesia Checklist: Patient identified, Emergency Drugs available, Suction available, Patient being monitored and Timeout performed Patient Re-evaluated:Patient Re-evaluated prior to induction Oxygen Delivery Method: Circle system utilized Preoxygenation: Pre-oxygenation with 100% oxygen Induction Type: IV induction, Rapid sequence and Cricoid Pressure applied Ventilation: Mask ventilation without difficulty Laryngoscope Size: Mac, 4 and Glidescope Grade View: Grade III Tube type: Oral Tube size: 7.5 mm Number of attempts: 3 Airway Equipment and Method: Stylet and Video-laryngoscopy Placement Confirmation: ETT inserted through vocal cords under direct vision,  positive ETCO2 and breath sounds checked- equal and bilateral Secured at: 23 cm Tube secured with: Tape Dental Injury: Teeth and Oropharynx as per pre-operative assessment  Difficulty Due To: Difficulty was unanticipated

## 2019-05-25 ENCOUNTER — Encounter (HOSPITAL_COMMUNITY): Payer: Self-pay | Admitting: Family Medicine

## 2019-05-25 DIAGNOSIS — O00101 Right tubal pregnancy without intrauterine pregnancy: Secondary | ICD-10-CM | POA: Diagnosis not present

## 2019-05-25 DIAGNOSIS — Z20828 Contact with and (suspected) exposure to other viral communicable diseases: Secondary | ICD-10-CM | POA: Diagnosis not present

## 2019-05-25 DIAGNOSIS — T85628A Displacement of other specified internal prosthetic devices, implants and grafts, initial encounter: Secondary | ICD-10-CM | POA: Diagnosis not present

## 2019-05-25 DIAGNOSIS — K661 Hemoperitoneum: Secondary | ICD-10-CM | POA: Diagnosis not present

## 2019-05-25 MED ORDER — FENTANYL CITRATE (PF) 100 MCG/2ML IJ SOLN
25.0000 ug | INTRAMUSCULAR | Status: DC | PRN
  Administered 2019-05-25 (×2): 50 ug via INTRAVENOUS

## 2019-05-25 MED ORDER — LIDOCAINE 2% (20 MG/ML) 5 ML SYRINGE
INTRAMUSCULAR | Status: AC
  Filled 2019-05-25: qty 10

## 2019-05-25 MED ORDER — OXYCODONE HCL 5 MG/5ML PO SOLN: 5.0000 mg | Freq: Once | ORAL | Status: DC | PRN

## 2019-05-25 MED ORDER — PROMETHAZINE HCL 25 MG/ML IJ SOLN: 6.2500 mg | INTRAMUSCULAR | Status: DC | PRN

## 2019-05-25 MED ORDER — FENTANYL CITRATE (PF) 100 MCG/2ML IJ SOLN
INTRAMUSCULAR | Status: AC
  Filled 2019-05-25: qty 2

## 2019-05-25 MED ORDER — PROPOFOL 10 MG/ML IV BOLUS
INTRAVENOUS | Status: AC
  Filled 2019-05-25: qty 60

## 2019-05-25 MED ORDER — OXYCODONE HCL 5 MG PO TABS: 5.0000 mg | ORAL_TABLET | Freq: Once | ORAL | Status: DC | PRN

## 2019-05-25 NOTE — Anesthesia Postprocedure Evaluation (Signed)
Anesthesia Post Note  Patient: Biomedical scientist  Procedure(s) Performed: LAPAROSCOPIC LEFT SALPINGECTOMY WITH REMOVAL OF ECTOPIC PREGNANCY (N/A ) Intrauterine Device (Iud) Removal     Patient location during evaluation: PACU Anesthesia Type: General Level of consciousness: awake and alert Pain management: pain level controlled Vital Signs Assessment: post-procedure vital signs reviewed and stable Respiratory status: spontaneous breathing, nonlabored ventilation and respiratory function stable Cardiovascular status: blood pressure returned to baseline and stable Postop Assessment: no apparent nausea or vomiting Anesthetic complications: no    Last Vitals:  Vitals:   05/25/19 0030 05/25/19 0045  BP: 123/83 123/83  Pulse:  78  Resp: 17 17  Temp:  (!) 36.4 C  SpO2: 100% 95%    Last Pain:  Vitals:   05/25/19 0045  TempSrc:   PainSc: [place] Pauleen Goleman

## 2019-05-26 LAB — SURGICAL PATHOLOGY

## 2019-05-30 ENCOUNTER — Encounter: Payer: Self-pay | Admitting: *Deleted

## 2019-06-09 ENCOUNTER — Other Ambulatory Visit: Payer: Self-pay

## 2019-06-09 ENCOUNTER — Ambulatory Visit (INDEPENDENT_AMBULATORY_CARE_PROVIDER_SITE_OTHER): Payer: Medicaid Other | Admitting: Obstetrics & Gynecology

## 2019-06-09 ENCOUNTER — Encounter: Payer: Self-pay | Admitting: Obstetrics & Gynecology

## 2019-06-09 VITALS — BP 129/83 | HR 81 | Wt 235.0 lb

## 2019-06-09 DIAGNOSIS — Z9889 Other specified postprocedural states: Secondary | ICD-10-CM

## 2019-06-09 DIAGNOSIS — Z3009 Encounter for other general counseling and advice on contraception: Secondary | ICD-10-CM

## 2019-06-09 NOTE — Progress Notes (Signed)
History:  39 y.o. [id number] here today for post op check following Laparoscopic right salpingectomy, IUD removal on 05/24/2019 for ectopic pregnancy. Pt denies pain or other issues. She reports that she did not know that she was pregnant and was very happy with her care at the [facility]. She does not want any pregnancies at this time.    The following portions of the patient's history were reviewed and updated as appropriate: allergies, current medications, past family history, past medical history, past social history, past surgical history and problem list.  Review of Systems:  Pertinent items are noted in HPI.    Objective:  Physical Exam Blood pressure (!) 132/91, pulse 82, weight 235 lb (106.6 kg), last menstrual period 05/17/2019.  CONSTITUTIONAL: Well-developed, well-nourished female in no acute distress.  HENT:  Normocephalic, atraumatic EYES: Conjunctivae and EOM are normal. No scleral icterus.  NECK: Normal range of motion SKIN: Skin is warm and dry. No rash noted. Not diaphoretic.No pallor. [place]: Alert and oriented to person, place, and time. Normal coordination.  Abd: Soft, nontender and nondistended. Port sites well healed.  Pelvic: not indicated.   Labs and Imaging US OB Comp < 14 Wks  Result Date: 05/24/2019 CLINICAL DATA:  Right lower quadrant pain beginning yesterday. Unknown LMP. IUD. EXAM: OBSTETRIC <14 WK [place] AND TRANSVAGINAL OB US DOPPLER ULTRASOUND OF OVARIES TECHNIQUE: Both transabdominal and transvaginal ultrasound examinations were performed for complete evaluation of the gestation as well as the maternal uterus, adnexal regions, and pelvic cul-de-sac. Transvaginal technique was performed to assess early pregnancy. Color and duplex Doppler ultrasound was utilized to evaluate blood flow to the ovaries. COMPARISON:  None. FINDINGS: Intrauterine gestational sac: None Maternal uterus/adnexae: IUD seen is seen within the endometrial cavity, however the distal tip of the IUD  is located in the lower uterine segment. No fibroids identified. The left ovary is normal in appearance. A 2.2 cm right ovarian corpus luteum cyst is seen. A ovoid mass with a cystic area resembling a gestational sac is seen in the right adnexa which measures 4.3 x 1.7 by 2.2 cm. This is highly suspicious for ectopic pregnancy. A small amount of complex free fluid is also seen in the pelvic cul-de-sac. Pulsed Doppler evaluation of both ovaries demonstrates normal appearing low-resistance arterial and venous waveforms. IMPRESSION: 4.3 cm right adnexal mass and small amount of free fluid, highly suspicious for ectopic pregnancy. Abnormally low IUD position within the endometrial cavity, with distal tip in lower uterine segment. No sonographic evidence for ovarian torsion. Critical Value/emergent results were called by telephone at the time of interpretation on 05/24/2019 at 7:52 pm to provider [facility] , who verbally acknowledged these results. Electronically Signed   By: Marlaine Hind M.D.   On: 05/24/2019 19:57   US OB Transvaginal  Result Date: 05/24/2019 CLINICAL DATA:  Right lower quadrant pain beginning yesterday. Unknown LMP. IUD. EXAM: OBSTETRIC <14 WK [place] AND TRANSVAGINAL OB US DOPPLER ULTRASOUND OF OVARIES TECHNIQUE: Both transabdominal and transvaginal ultrasound examinations were performed for complete evaluation of the gestation as well as the maternal uterus, adnexal regions, and pelvic cul-de-sac. Transvaginal technique was performed to assess early pregnancy. Color and duplex Doppler ultrasound was utilized to evaluate blood flow to the ovaries. COMPARISON:  None. FINDINGS: Intrauterine gestational sac: None Maternal uterus/adnexae: IUD seen is seen within the endometrial cavity, however the distal tip of the IUD is located in the lower uterine segment. No fibroids identified. The left ovary is normal in appearance. A 2.2 cm right ovarian  corpus luteum cyst is seen. A ovoid mass with a cystic  area resembling a gestational sac is seen in the right adnexa which measures 4.3 x 1.7 by 2.2 cm. This is highly suspicious for ectopic pregnancy. A small amount of complex free fluid is also seen in the pelvic cul-de-sac. Pulsed Doppler evaluation of both ovaries demonstrates normal appearing low-resistance arterial and venous waveforms. IMPRESSION: 4.3 cm right adnexal mass and small amount of free fluid, highly suspicious for ectopic pregnancy. Abnormally low IUD position within the endometrial cavity, with distal tip in lower uterine segment. No sonographic evidence for ovarian torsion. Critical Value/emergent results were called by telephone at the time of interpretation on 05/24/2019 at 7:52 pm to provider [facility] , who verbally acknowledged these results. Electronically Signed   By: Danae Orleans M.D.   On: 05/24/2019 19:57   US Abdomen Limited  Result Date: 05/24/2019 CLINICAL DATA:  Right lower quadrant pain beginning yesterday. EXAM: ULTRASOUND ABDOMEN LIMITED TECHNIQUE: Wallace Cullens scale imaging of the right lower quadrant was performed to evaluate for suspected appendicitis. Standard imaging planes and graded compression technique were utilized. COMPARISON:  None. FINDINGS: The appendix is not visualized. Ancillary findings: None. Factors affecting image quality: Suboptimal due to body habitus Other findings: None. IMPRESSION: Suboptimal exam due to body habitus. Non visualization of the appendix. Non-visualization of appendix by [place] does not exclude appendicitis. Electronically Signed   By: Danae Orleans M.D.   On: 05/24/2019 19:59   US PELVIC DOPPLER LIMITED  Result Date: 05/24/2019 CLINICAL DATA:  Right lower quadrant pain beginning yesterday. Unknown LMP. IUD. EXAM: OBSTETRIC <14 WK [place] AND TRANSVAGINAL OB US DOPPLER ULTRASOUND OF OVARIES TECHNIQUE: Both transabdominal and transvaginal ultrasound examinations were performed for complete evaluation of the gestation as well as the maternal  uterus, adnexal regions, and pelvic cul-de-sac. Transvaginal technique was performed to assess early pregnancy. Color and duplex Doppler ultrasound was utilized to evaluate blood flow to the ovaries. COMPARISON:  None. FINDINGS: Intrauterine gestational sac: None Maternal uterus/adnexae: IUD seen is seen within the endometrial cavity, however the distal tip of the IUD is located in the lower uterine segment. No fibroids identified. The left ovary is normal in appearance. A 2.2 cm right ovarian corpus luteum cyst is seen. A ovoid mass with a cystic area resembling a gestational sac is seen in the right adnexa which measures 4.3 x 1.7 by 2.2 cm. This is highly suspicious for ectopic pregnancy. A small amount of complex free fluid is also seen in the pelvic cul-de-sac. Pulsed Doppler evaluation of both ovaries demonstrates normal appearing low-resistance arterial and venous waveforms. IMPRESSION: 4.3 cm right adnexal mass and small amount of free fluid, highly suspicious for ectopic pregnancy. Abnormally low IUD position within the endometrial cavity, with distal tip in lower uterine segment. No sonographic evidence for ovarian torsion. Critical Value/emergent results were called by telephone at the time of interpretation on 05/24/2019 at 7:52 pm to provider [facility] , who verbally acknowledged these results. Electronically Signed   By: Danae Orleans M.D.   On: 05/24/2019 19:57    Assessment & Plan:  2 week post op check.   Doing well  Contraception counseling: Reviewed all forms of birth control options available including abstinence; over the counter/barrier methods; hormonal contraceptive medication including pill, patch, ring, injection,contraceptive implant; hormonal and nonhormonal IUDs; permanent sterilization options including vasectomy and the various tubal sterilization modalities. Risks and benefits reviewed.  Questions were answered.  Information was given to patient to review. Pt  is still  undecided at present.   Garin Mata L. Harraway-Smith, M.D., [place]

## 2019-06-09 NOTE — Patient Instructions (Signed)
Contraception Choices Contraception, also called birth control, refers to methods or devices that prevent pregnancy. Hormonal methods Contraceptive implant  A contraceptive implant is a thin, plastic tube that contains a hormone. It is inserted into the upper part of the arm. It can remain in place for up to 3 years. Progestin-only injections Progestin-only injections are injections of progestin, a synthetic form of the hormone progesterone. They are given every 3 months by a health care provider. Birth control pills  Birth control pills are pills that contain hormones that prevent pregnancy. They must be taken once a day, preferably at the same time each day. Birth control patch  The birth control patch contains hormones that prevent pregnancy. It is placed on the skin and must be changed once a week for three weeks and removed on the fourth week. A prescription is needed to use this method of contraception. Vaginal ring  A vaginal ring contains hormones that prevent pregnancy. It is placed in the vagina for three weeks and removed on the fourth week. After that, the process is repeated with a new ring. A prescription is needed to use this method of contraception. Emergency contraceptive Emergency contraceptives prevent pregnancy after unprotected sex. They come in pill form and can be taken up to 5 days after sex. They work best the sooner they are taken after having sex. Most emergency contraceptives are available without a prescription. This method should not be used as your only form of birth control. Barrier methods Female condom  A female condom is a thin sheath that is worn over the penis during sex. Condoms keep sperm from going inside a woman's body. They can be used with a spermicide to increase their effectiveness. They should be disposed after a single use. Female condom  A female condom is a soft, loose-fitting sheath that is put into the vagina before sex. The condom keeps sperm  from going inside a woman's body. They should be disposed after a single use. Diaphragm  A diaphragm is a soft, dome-shaped barrier. It is inserted into the vagina before sex, along with a spermicide. The diaphragm blocks sperm from entering the uterus, and the spermicide kills sperm. A diaphragm should be left in the vagina for 6-8 hours after sex and removed within 24 hours. A diaphragm is prescribed and fitted by a health care provider. A diaphragm should be replaced every 1-2 years, after giving birth, after gaining more than 15 lb (6.8 kg), and after pelvic surgery. Cervical cap  A cervical cap is a round, soft latex or plastic cup that fits over the cervix. It is inserted into the vagina before sex, along with spermicide. It blocks sperm from entering the uterus. The cap should be left in place for 6-8 hours after sex and removed within 48 hours. A cervical cap must be prescribed and fitted by a health care provider. It should be replaced every 2 years. Sponge  A sponge is a soft, circular piece of polyurethane foam with spermicide on it. The sponge helps block sperm from entering the uterus, and the spermicide kills sperm. To use it, you make it wet and then insert it into the vagina. It should be inserted before sex, left in for at least 6 hours after sex, and removed and thrown away within 30 hours. Spermicides Spermicides are chemicals that kill or block sperm from entering the cervix and uterus. They can come as a cream, jelly, suppository, foam, or tablet. A spermicide should be inserted into the   vagina with an applicator at least 10-15 minutes before sex to allow time for it to work. The process must be repeated every time you have sex. Spermicides do not require a prescription. Intrauterine contraception Intrauterine device (IUD) An IUD is a T-shaped device that is put in a woman's uterus. There are two types:  Hormone IUD.This type contains progestin, a synthetic form of the hormone  progesterone. This type can stay in place for 3-5 years.  Copper IUD.This type is wrapped in copper wire. It can stay in place for 10 years.  Permanent methods of contraception Female tubal ligation In this method, a woman's fallopian tubes are sealed, tied, or blocked during surgery to prevent eggs from traveling to the uterus. Hysteroscopic sterilization In this method, a small, flexible insert is placed into each fallopian tube. The inserts cause scar tissue to form in the fallopian tubes and block them, so sperm cannot reach an egg. The procedure takes about 3 months to be effective. Another form of birth control must be used during those 3 months. Female sterilization This is a procedure to tie off the tubes that carry sperm (vasectomy). After the procedure, the man can still ejaculate fluid (semen). Natural planning methods Natural family planning In this method, a couple does not have sex on days when the woman could become pregnant. Calendar method This means keeping track of the length of each menstrual cycle, identifying the days when pregnancy can happen, and not having sex on those days. Ovulation method In this method, a couple avoids sex during ovulation. Symptothermal method This method involves not having sex during ovulation. The woman typically checks for ovulation by watching changes in her temperature and in the consistency of cervical mucus. Post-ovulation method In this method, a couple waits to have sex until after ovulation. Summary  Contraception, also called birth control, means methods or devices that prevent pregnancy.  Hormonal methods of contraception include implants, injections, pills, patches, vaginal rings, and emergency contraceptives.  Barrier methods of contraception can include female condoms, female condoms, diaphragms, cervical caps, sponges, and spermicides.  There are two types of IUDs (intrauterine devices). An IUD can be put in a woman's uterus to  prevent pregnancy for 3-5 years.  Permanent sterilization can be done through a procedure for males, females, or both.  Natural family planning methods involve not having sex on days when the woman could become pregnant. This information is not intended to replace advice given to you by your health care provider. Make sure you discuss any questions you have with your health care provider. Document Revised: 05/14/2017 Document Reviewed: 06/14/2016 Elsevier Patient Education  2020 [employer]  

## 2021-04-20 IMAGING — US US PELVIC DOPPLER LIMITED
2 series · 13 of 25 positions shown · non-contrast
Comparison: None.

CLINICAL DATA: Right lower quadrant pain beginning yesterday.
Unknown LMP. IUD.



[Series 1: us pelvic doppler limited · 7 of 138 slices shown (1 of 2)]
[im 1/138]
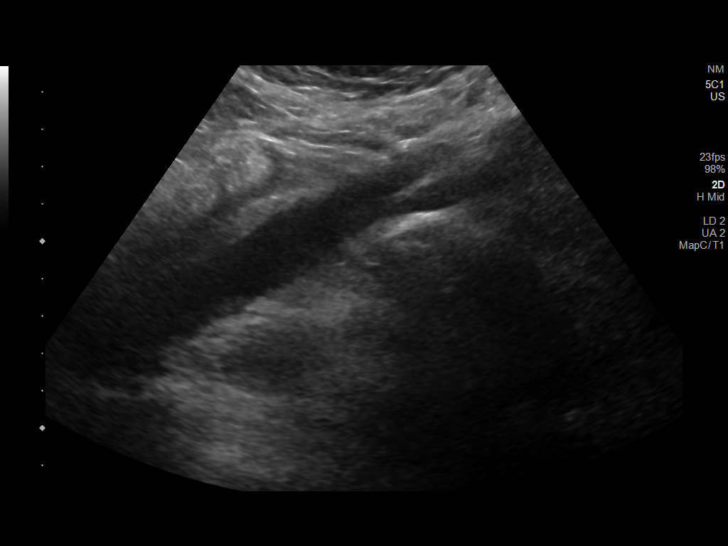
[im 23/138]
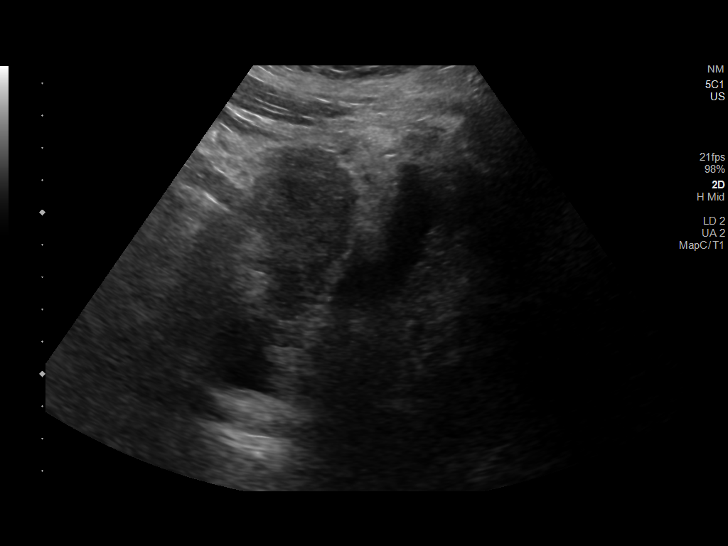
[im 46/138]
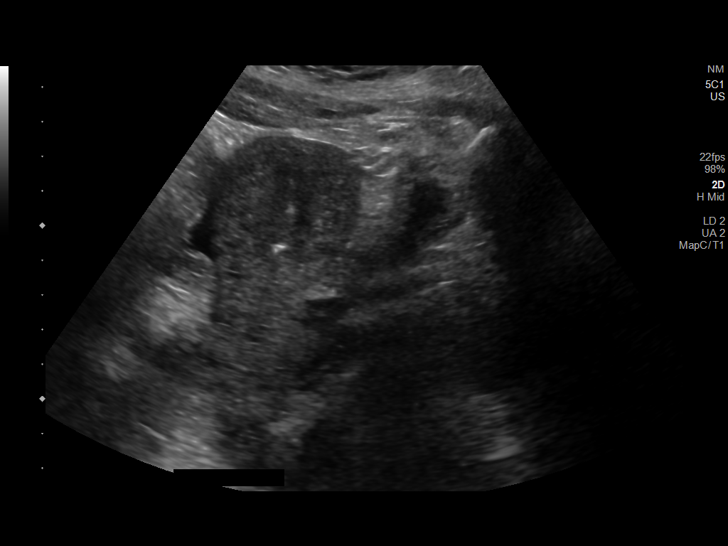
[im 69/138]
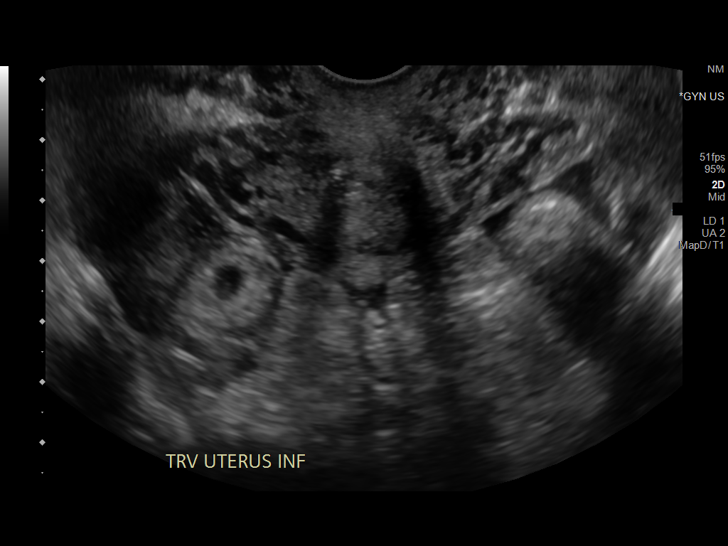
[im 92/138]
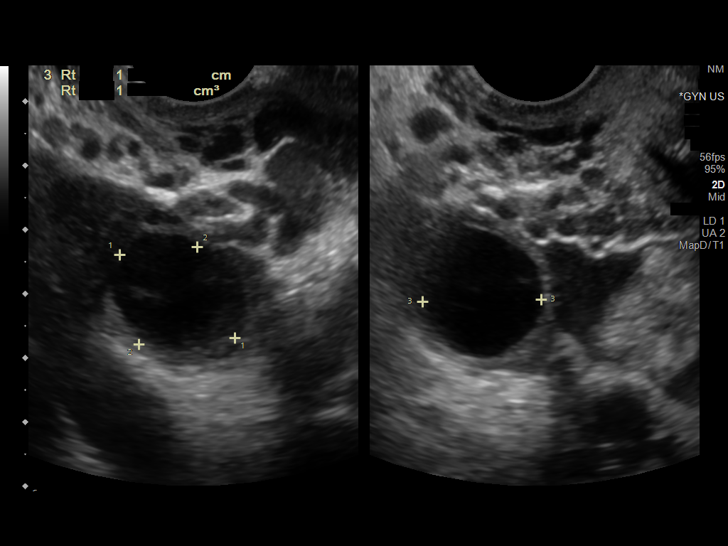
[im 115/138]
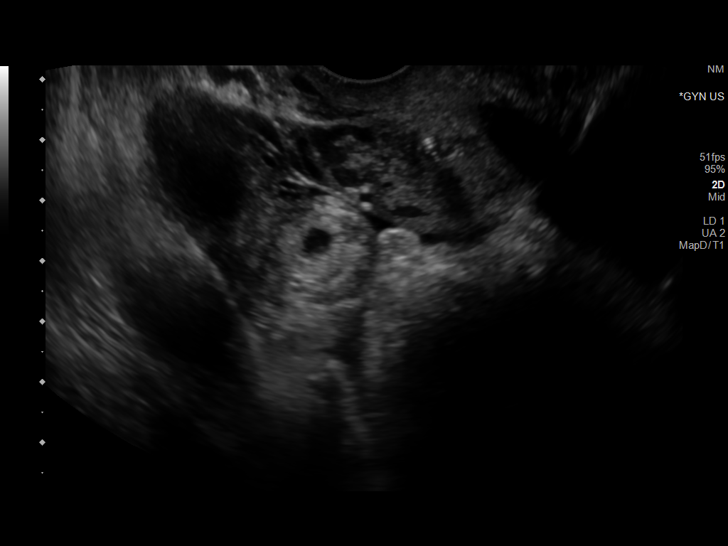
[im 138/138]
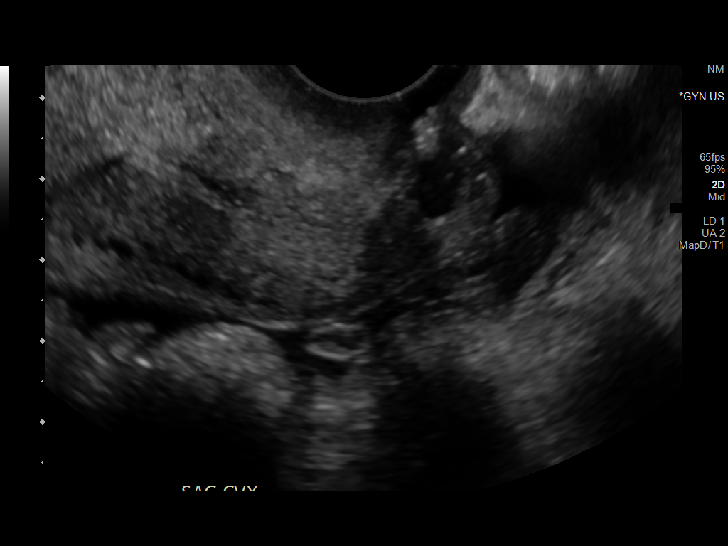

[Series 1: us pelvic doppler limited · 6 of 138 slices shown (2 of 2)]
[im 13/138]
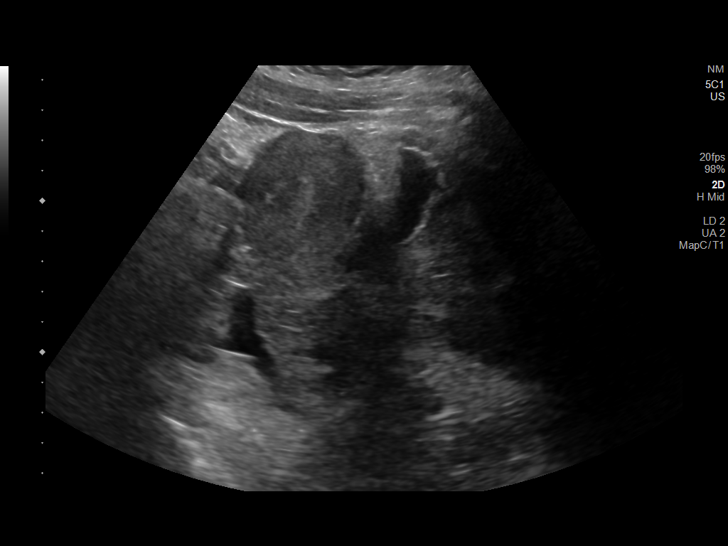
[im 38/138]
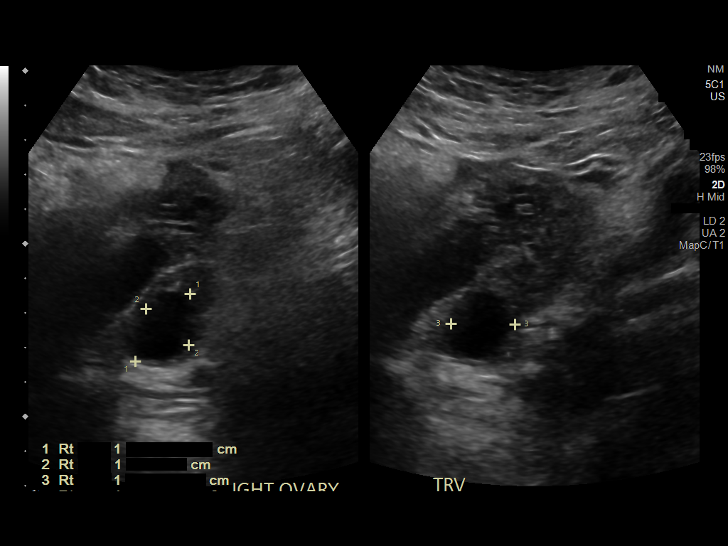
[im 63/138]
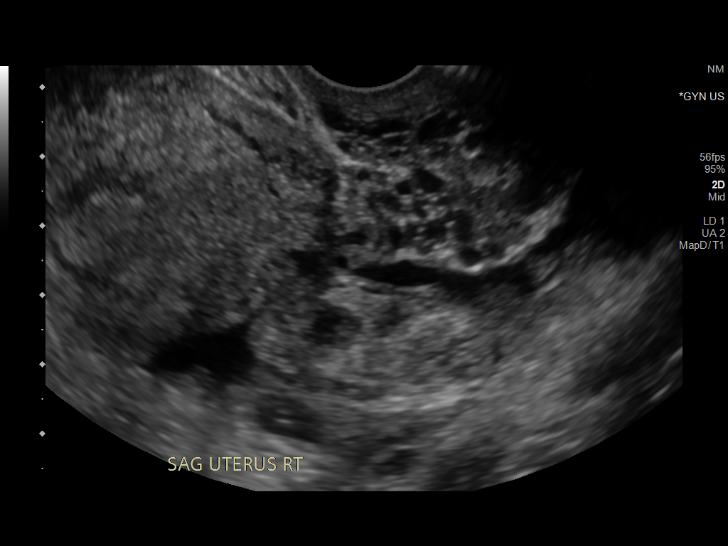
[im 88/138]
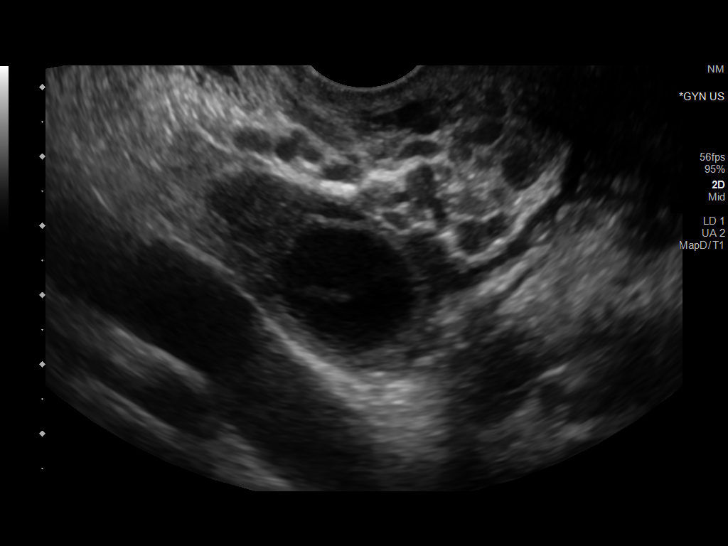
[im 113/138]
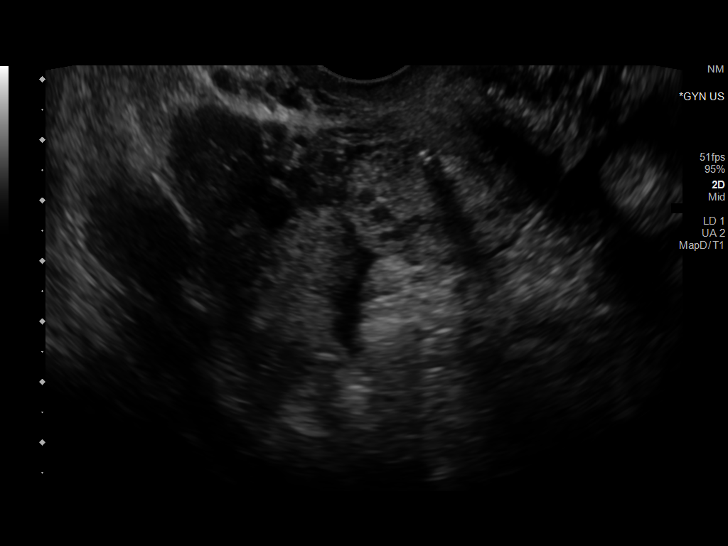
[im 138/138]
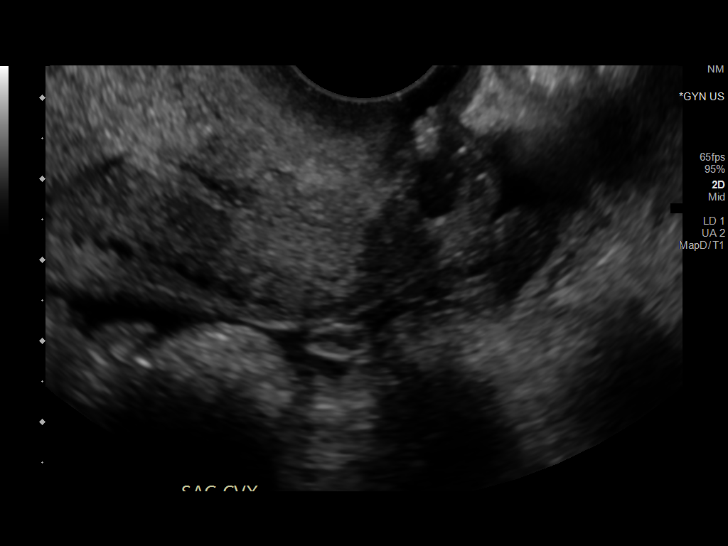

[13 of 25 positions shown; findings below may reference images not displayed]

FINDINGS: Intrauterine gestational sac: None

Maternal uterus/adnexae: IUD seen is seen within the endometrial
cavity, however the distal tip of the IUD is located in the lower
uterine segment. No fibroids identified.

The left ovary is normal in appearance. A 2.2 cm right ovarian
corpus luteum cyst is seen. A ovoid mass with a cystic area
resembling a gestational sac is seen in the right adnexa which
measures 4.3 x 1.7 by 2.2 cm. This is highly suspicious for ectopic
pregnancy. A small amount of complex free fluid is also seen in the
pelvic cul-de-sac.

Pulsed Doppler evaluation of both ovaries demonstrates normal
appearing low-resistance arterial and venous waveforms.
IMPRESSION: 4.3 cm right adnexal mass and small amount of free fluid, highly
suspicious for ectopic pregnancy.

Abnormally low IUD position within the endometrial cavity, with
distal tip in lower uterine segment.

No sonographic evidence for ovarian torsion.

Critical Value/emergent results were called by telephone at the time
of interpretation on 05/24/2019 at [DATE] to provider TORES
PLECHY , who verbally acknowledged these results.

## 2021-04-20 IMAGING — US US OB TRANSVAGINAL
1 series · 13 of 28 positions shown · non-contrast
Comparison: None.

CLINICAL DATA: Right lower quadrant pain beginning yesterday.
Unknown LMP. IUD.



[Series 1: us ob transvaginal · 13 of 138 slices shown]
[im 6/138]
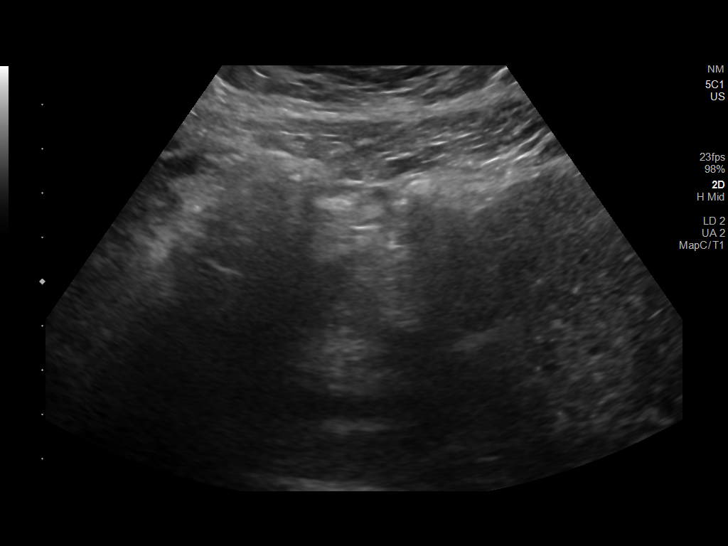
[im 16/138]
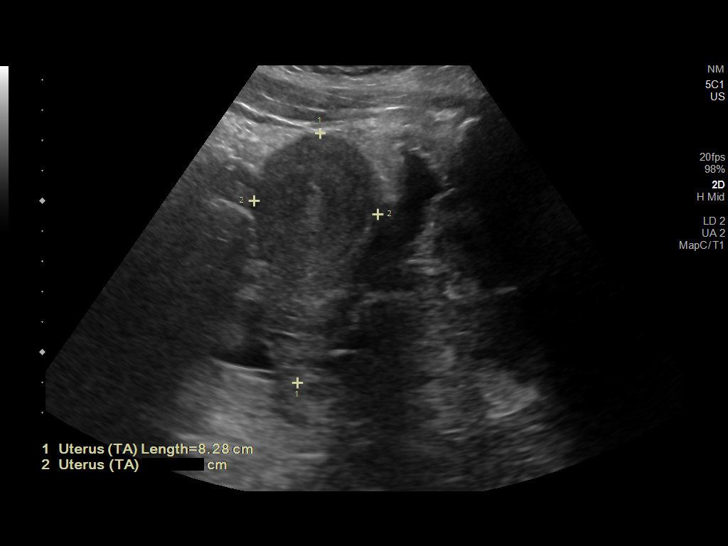
[im 26/138]
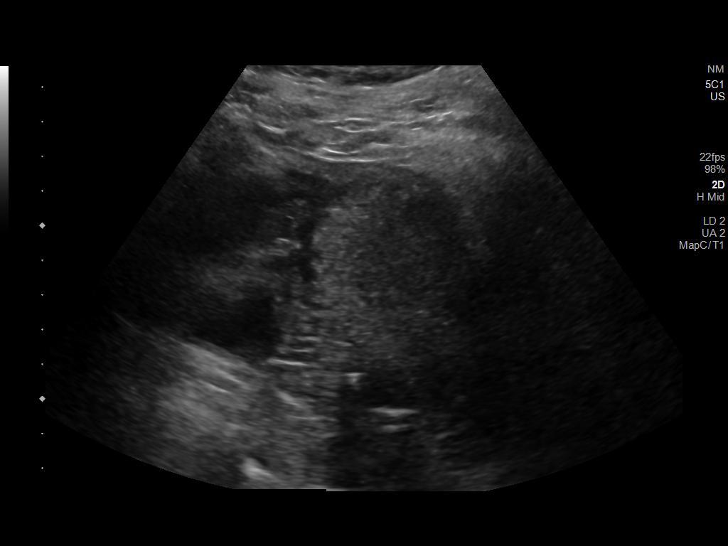
[im 36/138]
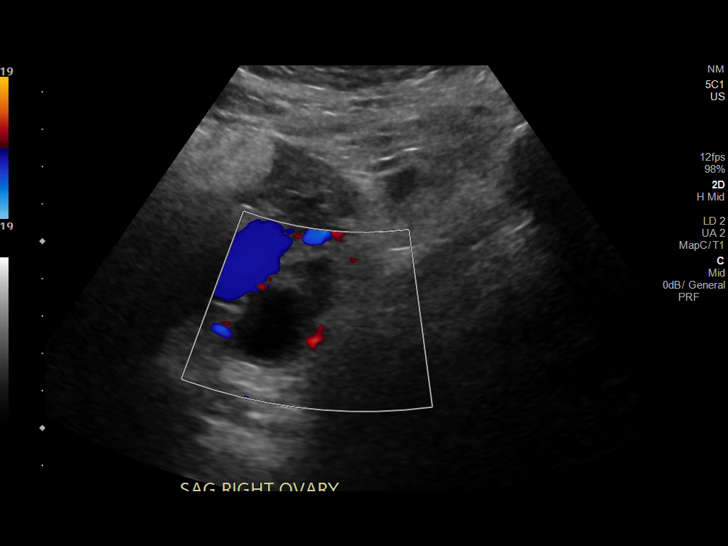
[im 46/138]
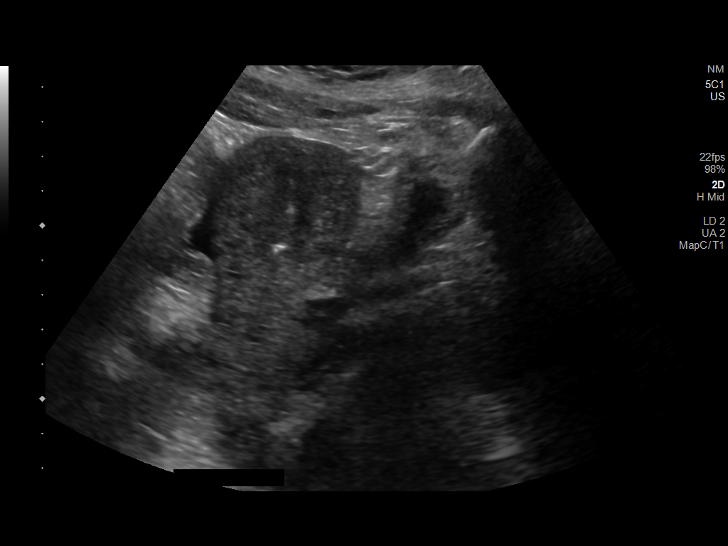
[im 56/138]
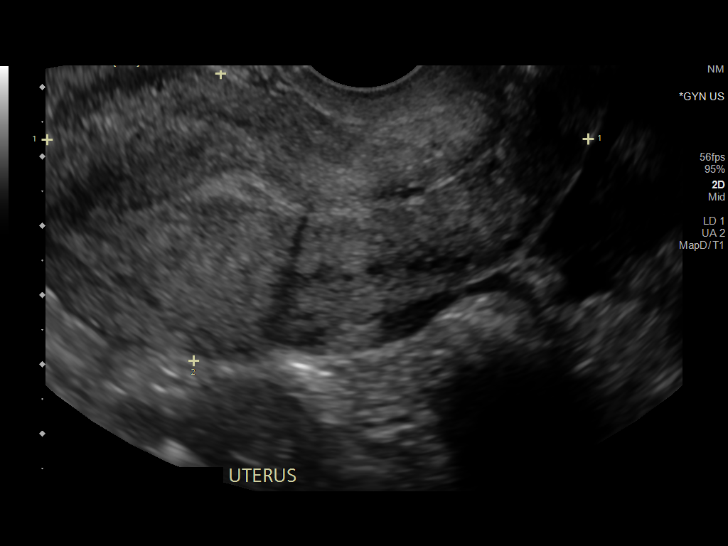
[im 72/138]
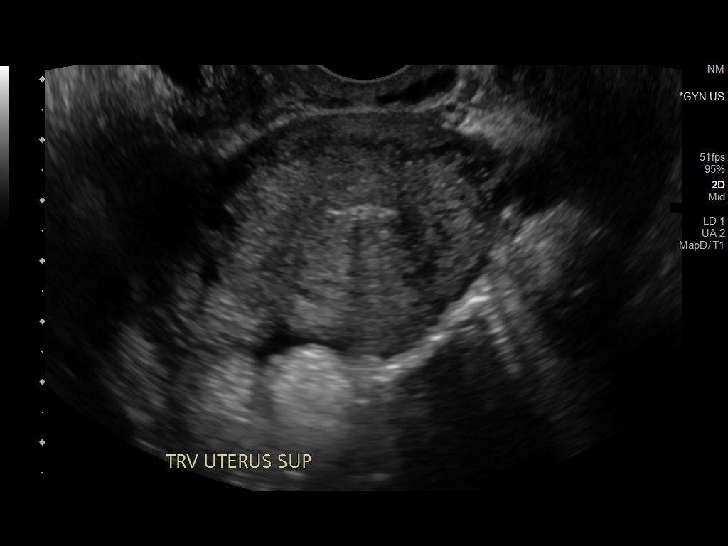
[im 82/138]
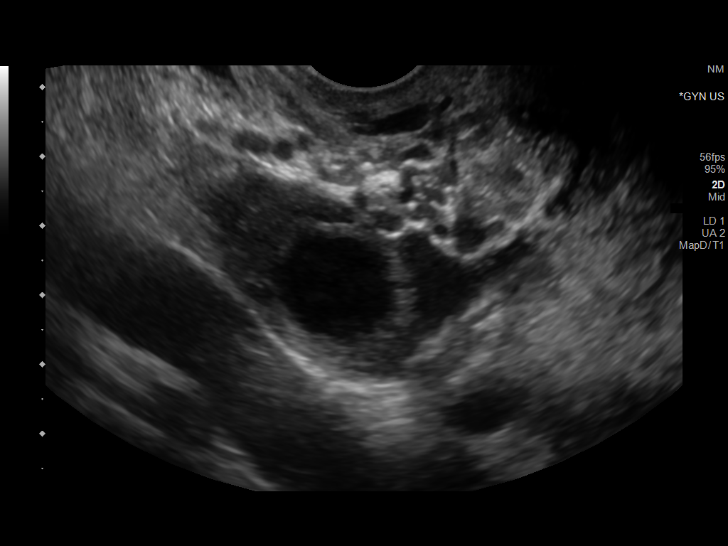
[im 92/138]
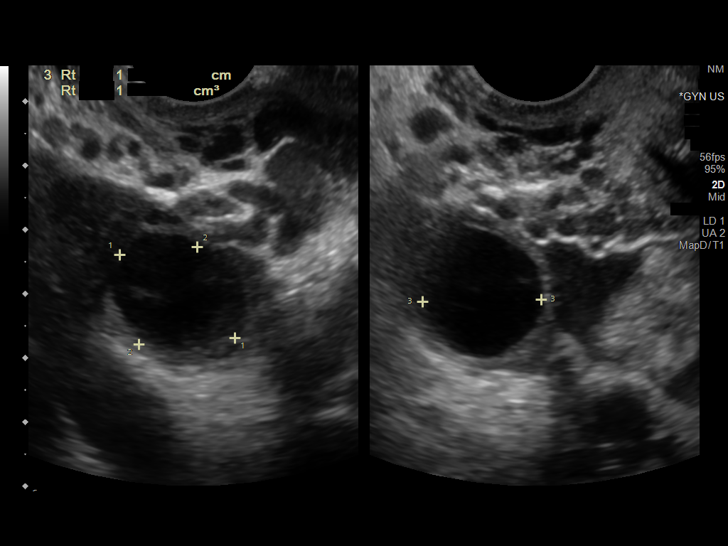
[im 102/138]
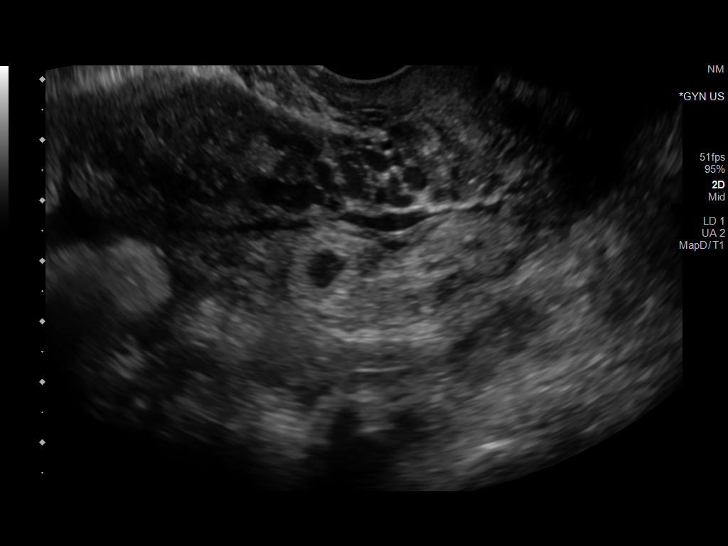
[im 112/138]
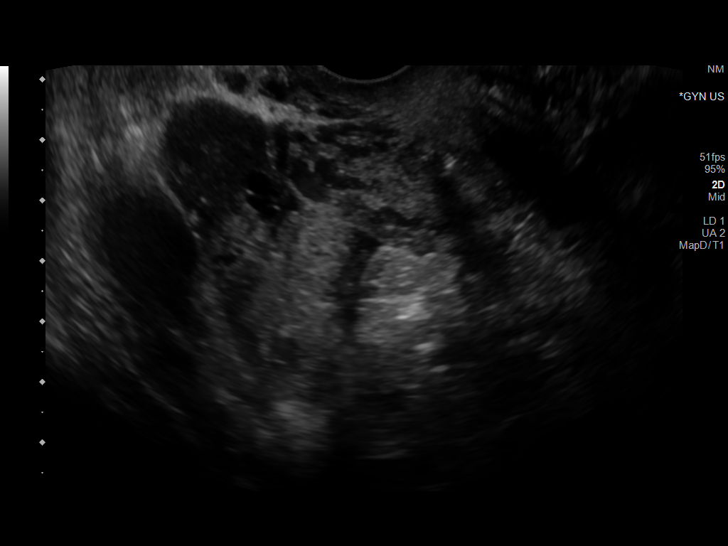
[im 122/138]
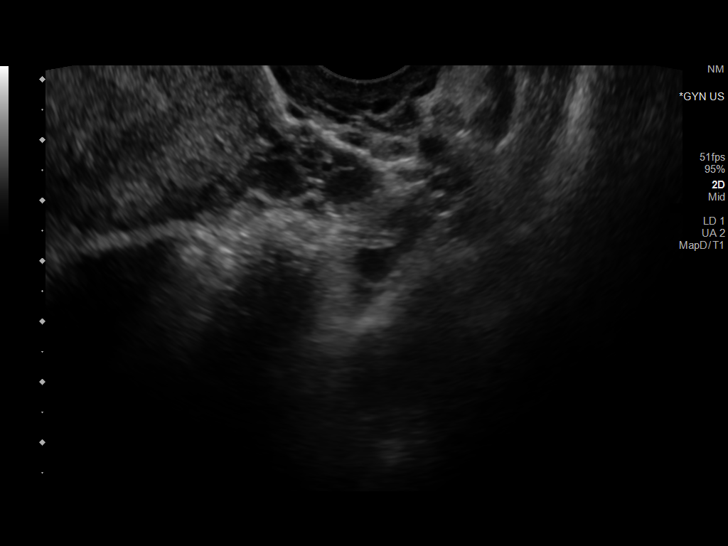
[im 132/138]
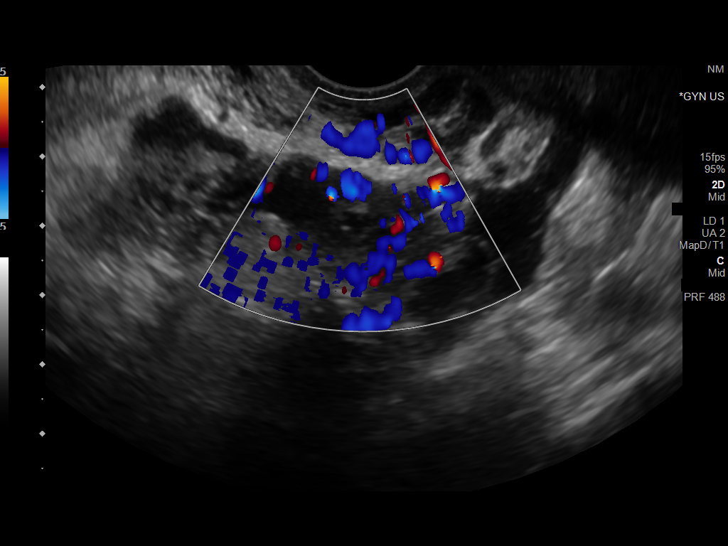

[13 of 28 positions shown; findings below may reference images not displayed]

FINDINGS: Intrauterine gestational sac: None

Maternal uterus/adnexae: IUD seen is seen within the endometrial
cavity, however the distal tip of the IUD is located in the lower
uterine segment. No fibroids identified.

The left ovary is normal in appearance. A 2.2 cm right ovarian
corpus luteum cyst is seen. A ovoid mass with a cystic area
resembling a gestational sac is seen in the right adnexa which
measures 4.3 x 1.7 by 2.2 cm. This is highly suspicious for ectopic
pregnancy. A small amount of complex free fluid is also seen in the
pelvic cul-de-sac.

Pulsed Doppler evaluation of both ovaries demonstrates normal
appearing low-resistance arterial and venous waveforms.
IMPRESSION: 4.3 cm right adnexal mass and small amount of free fluid, highly
suspicious for ectopic pregnancy.

Abnormally low IUD position within the endometrial cavity, with
distal tip in lower uterine segment.

No sonographic evidence for ovarian torsion.

Critical Value/emergent results were called by telephone at the time
of interpretation on 05/24/2019 at [DATE] to provider TORES
PLECHY , who verbally acknowledged these results.
